# Patient Record
Sex: Male | Born: 1995 | Race: White | Hispanic: No | Marital: Single | Smoking: Never smoker
Health system: Southern US, Community
[De-identification: ages and names within clinical notes are randomized; demographics above are authoritative.]

---
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
## 2015-07-13 ENCOUNTER — Telehealth: Payer: Self-pay | Admitting: *Deleted

## 2015-07-13 ENCOUNTER — Encounter: Payer: Self-pay | Admitting: Family

## 2015-07-13 ENCOUNTER — Ambulatory Visit (INDEPENDENT_AMBULATORY_CARE_PROVIDER_SITE_OTHER): Payer: Managed Care, Other (non HMO) | Admitting: Family

## 2015-07-13 ENCOUNTER — Telehealth: Payer: Self-pay | Admitting: Family

## 2015-07-13 VITALS — BP 100/70 | HR 62 | Temp 97.7°F | Resp 16 | Ht 70.5 in | Wt 177.6 lb

## 2015-07-13 DIAGNOSIS — Z0000 Encounter for general adult medical examination without abnormal findings: Secondary | ICD-10-CM | POA: Diagnosis not present

## 2015-07-13 DIAGNOSIS — Z23 Encounter for immunization: Secondary | ICD-10-CM | POA: Diagnosis not present

## 2015-07-13 LAB — LIPID PANEL
Cholesterol: 190 mg/dL (ref 0–200)
HDL: 42.7 mg/dL (ref >39.00)
LDL CALC: 134 mg/dL — AB (ref 0–99)
NONHDL: 147.5
Total CHOL/HDL Ratio: 4
Triglycerides: 67 mg/dL (ref 0.0–149.0)
VLDL: 13.4 mg/dL (ref 0.0–40.0)

## 2015-07-13 LAB — URINALYSIS
Hgb urine dipstick: NEGATIVE
KETONES UR: NEGATIVE
Leukocytes, UA: NEGATIVE
Nitrite: NEGATIVE
PH: 5.5 (ref 5.0–8.0)
Specific Gravity, Urine: >=1.03 — AB (ref 1.000–1.030)
TOTAL PROTEIN, URINE-UPE24: NEGATIVE
Urine Glucose: NEGATIVE
Urobilinogen, UA: 0.2 (ref 0.0–1.0)

## 2015-07-13 NOTE — Patient Instructions (Signed)
Please complete lab work prior to leaving. Work on [employer]. Try preparing food to bring with you to school/work rather than fast food. Follow up in 2 months for Gardisil #2 Follow up in 6 months for Hepatitis A vaccine #2 and Gardisil #3. Welcome to [facility]!

## 2015-07-13 NOTE — Telephone Encounter (Signed)
Pt returned my call and was notified of below instructions and voices understanding.

## 2015-07-13 NOTE — Progress Notes (Signed)
Pre visit review using our clinic review tool, if applicable. No additional management support is needed unless otherwise documented below in the visit note. 

## 2015-07-13 NOTE — Telephone Encounter (Signed)
Due to patient school schedule requesting early appointment, nurse schedule conflicts. Approved by Nicki Guadalajara to schedule on provider schedule for Follow up in 2 months for Gardisil #2 Follow up in 6 months for Hepatitis A vaccine #2 and Gardisil #3.

## 2015-07-13 NOTE — Telephone Encounter (Signed)
Received call from pt's mother wanting verification of additional breathing testing needed. Advised her that I cannot discuss PHI with her until I receive authorization from pt. She voices understanding. Cell # listed [phone]) is pt's mom's. She gave me [phone] for pt. Will notify him that PFT has been scheduled for 07/16/15 at 11am at [facility]. He should arrive by 10:45am and register in admissions (main entrance). No caffeine for 4 hours prior to test, may eat prior. Call [phone] if any questions.  Left message for pt to return my call.

## 2015-07-13 NOTE — Progress Notes (Signed)
Subjective:    Patient ID: Bobby Rogers, male    DOB: May 01, 1996, 19 y.o.   MRN: [id number]  HPI  Bobby Rogers is a 19 yr old male who presents today to establish care.  Patient presents today for complete physical.  Immunizations: Diet:could be better, eats out too much. Exercise:  Regular exercise Dental:  Due   Review of Systems  Constitutional: Negative for unexpected weight change.  HENT: Negative for hearing loss and rhinorrhea.   Eyes: Negative for visual disturbance.  Respiratory: Negative for cough and shortness of breath.   Cardiovascular: Negative for chest pain.  Gastrointestinal: Negative for diarrhea and constipation.  Genitourinary: Negative for dysuria and frequency.  Musculoskeletal:       L knee pain x 2 days.    Skin: Negative for rash.  Neurological: Negative for headaches.  Hematological: Negative for adenopathy.  Psychiatric/Behavioral:       Denies depression/anxiety   History reviewed. No pertinent past medical history.  History   Social History  . Marital Status: Single    Spouse Name: N/A  . Number of Children: N/A  . Years of Education: N/A   Occupational History  . Not on file.   Social History Main Topics  . Smoking status: Never Smoker   . Smokeless tobacco: Not on file  . Alcohol Use: No     Comment: social  . Drug Use: No  . Sexual Activity: Not on file   Other Topics Concern  . Not on file   Social History Narrative    History reviewed. No pertinent past surgical history.  Family History  Problem Relation Age of Onset  . Sudden death Neg Hx   . Hypertension Neg Hx   . Hyperlipidemia Neg Hx   . Heart attack Neg Hx   . Diabetes Neg Hx     Not on File  No current outpatient prescriptions on file prior to visit.   No current facility-administered medications on file prior to visit.    BP 100/70 mmHg  Pulse 62  Temp(Src) 97.7 F (36.5 C) (Oral)  Resp 16  Ht 5' 10.5" (1.791 m)  Wt 177 lb 9.6 oz (80.559 kg)   BMI 25.11 kg/m2  SpO2 99%       Objective:   Physical Exam  Physical Exam  Constitutional: He is oriented to person, place, and time. He appears well-developed and well-nourished. No distress.  HENT:  Head: Normocephalic and atraumatic.  Right Ear: Tympanic membrane and ear canal normal.  Left Ear: Tympanic membrane and ear canal normal.  Mouth/Throat: Oropharynx is clear and moist.  Eyes: Pupils are equal, round, and reactive to light. No scleral icterus.  Neck: Normal range of motion. No thyromegaly present.  Cardiovascular: Normal rate and regular rhythm.   No murmur heard. Pulmonary/Chest: Effort normal and breath sounds normal. No respiratory distress. He has no wheezes. He has no rales. He exhibits no tenderness.  Abdominal: Soft. Bowel sounds are normal. He exhibits no distension and no mass. There is no tenderness. There is no rebound and no guarding.  Musculoskeletal: He exhibits no edema. No knee swelling is noted Lymphadenopathy:    He has no cervical adenopathy.  Neurological: He is alert and oriented to person, place, and time. He has normal patellar reflexes. He exhibits normal muscle tone. Coordination normal.  Skin: Skin is warm and dry.  Psychiatric: He has a normal mood and affect. His behavior is normal. Judgment and thought content normal.  Assessment & Plan:         Assessment & Plan:  Addendum: pt was unable to complete PFT's successfully in our office. Will refer for formal PFT's.   Advised short course of aleve for knee pain and let me know if symptoms worsen or do not improve.

## 2015-07-13 NOTE — Assessment & Plan Note (Signed)
PFT's today (needed for form for [employer]).  Obtain UA and Lipids. Discussed healthy diet. Continue regular exercise. Due for HPV, Tdap, varicella #2, Hep A #1, Meningococcal

## 2015-07-14 ENCOUNTER — Encounter: Payer: Self-pay | Admitting: Family

## 2015-07-15 ENCOUNTER — Ambulatory Visit (HOSPITAL_COMMUNITY)
Admission: RE | Admit: 2015-07-15 | Discharge: 2015-07-15 | Disposition: A | Discharge status: Home / Self Care | Payer: 59 | Source: Ambulatory Visit | Attending: Family | Admitting: Family

## 2015-07-15 DIAGNOSIS — Z0000 Encounter for general adult medical examination without abnormal findings: Secondary | ICD-10-CM | POA: Diagnosis not present

## 2015-07-15 LAB — SPIROMETRY WITH GRAPH
DL/VA % pred: 96 %
DL/VA: 4.54 ml/min/mmHg/L
DLCO UNC % PRED: 100 %
DLCO UNC: 32.37 ml/min/mmHg
FEF 25-75 PRE: 2.83 L/s
FEF2575-%Pred-Pre: 57 %
FEV1-%Pred-Pre: 79 %
FEV1-Pre: 3.67 L
FEV1FVC-%Pred-Pre: 82 %
FEV6-%PRED-PRE: 93 %
FEV6-Pre: 5.15 L
FEV6FVC-%PRED-PRE: 100 %
FVC-%PRED-PRE: 96 %
FVC-Pre: 5.31 L
Pre FEV1/FVC ratio: 69 %
Pre FEV6/FVC Ratio: 100 %
RV % pred: 113 %
RV: 1.6 L
TLC % pred: 99 %
TLC: 6.84 L

## 2015-07-16 ENCOUNTER — Telehealth: Payer: Self-pay | Admitting: *Deleted

## 2015-07-16 DIAGNOSIS — R942 Abnormal results of pulmonary function studies: Secondary | ICD-10-CM

## 2015-07-16 NOTE — Telephone Encounter (Signed)
Pt needs form for college as soon as possible. Please see PFT report from 07/15/15.

## 2015-07-19 NOTE — Telephone Encounter (Signed)
wAS ABLE TO GET APPOINTMENT ON tHURSDAY THIS WEEK WITH dR wERT

## 2015-07-19 NOTE — Telephone Encounter (Signed)
Left message for pt on home phone.    PFT's are abnormal.  I would like to get him in with pulmonary for clearance.  Marj/Jen- is it possible to get him in with pulmonary today, tomorrow or Wednesday?  We can set him  Up with Rubye Oaks NP if she is able to see pt.  He has time restraint for his clearance for school.

## 2015-07-19 NOTE — Telephone Encounter (Signed)
Patient is scheduled for 07/22/15

## 2015-07-22 ENCOUNTER — Ambulatory Visit (INDEPENDENT_AMBULATORY_CARE_PROVIDER_SITE_OTHER): Payer: Managed Care, Other (non HMO) | Admitting: Internal Medicine

## 2015-07-22 ENCOUNTER — Encounter: Payer: Self-pay | Admitting: *Deleted

## 2015-07-22 ENCOUNTER — Encounter: Payer: Self-pay | Admitting: Internal Medicine

## 2015-07-22 VITALS — BP 118/78 | HR 62 | Ht 70.5 in | Wt 182.0 lb

## 2015-07-22 DIAGNOSIS — R942 Abnormal results of pulmonary function studies: Secondary | ICD-10-CM

## 2015-07-22 NOTE — Progress Notes (Signed)
   Subjective:    Patient ID: Bobby Rogers, male    DOB: [birth year],    MRN: [id number]  HPI  28 yowm never smoker nl term deliver/ no illnesses as infant per phone call to mom, no memory of any problem as child/ adolescent with sob/cough or freq resp infections or ever need for inhalers  but referred by Dr Peggyann Juba 07/22/2015 to pulmonary clinic after he failed a screening spirometry.   07/22/2015 1st [facility] office visit/ Bobby Rogers   Chief Complaint  Patient presents with  . Pulmonary Consult    Referred by Sandford Craze for abnormal PFT   able to ex in all condtions all year round including intense exercise s resp limitations   No obvious  day to day or daytime variabilty or assoc chronic cough or cp or chest tightness, subjective wheeze overt sinus or hb symptoms. No unusual exp hx or h/o childhood pna/ asthma or knowledge of premature birth.  Sleeping ok without nocturnal  or early am exacerbation  of respiratory  c/o's or need for noct saba. Also denies any obvious fluctuation of symptoms with weather or environmental changes or other aggravating or alleviating factors except as outlined above   Current Medications, Allergies, Complete Past Medical History, Past Surgical History, Family History, and Social History were reviewed in [employer] record.           Review of Systems  Constitutional: Negative for fever, chills, activity change, appetite change and unexpected weight change.  HENT: Negative for congestion, dental problem, postnasal drip, rhinorrhea, sneezing, sore throat, trouble swallowing and voice change.   Eyes: Negative for visual disturbance.  Respiratory: Negative for cough, choking and shortness of breath.   Cardiovascular: Negative for chest pain and leg swelling.  Gastrointestinal: Negative for nausea, vomiting and abdominal pain.  Genitourinary: Negative for difficulty urinating.  Musculoskeletal: Negative for arthralgias.    Skin: Negative for rash.  Psychiatric/Behavioral: Negative for behavioral problems and confusion.       Objective:   Physical Exam  amb wm nad  Wt Readings from Last 3 Encounters:  07/22/15 182 lb (82.555 kg) (83 %*, Z = 0.95)  07/13/15 177 lb 9.6 oz (80.559 kg) (79 %*, Z = 0.82)  05/28/14 164 lb (74.39 kg) (70 %*, Z = 0.52)   * Growth percentiles are based on CDC 2-20 Years data.    Vital signs reviewed   HEENT: nl dentition, turbinates, and orophanx. Nl external ear canals without cough reflex   NECK :  without JVD/Nodes/TM/ nl carotid upstrokes bilaterally   LUNGS: no acc muscle use, clear to A and P bilaterally without cough on insp or exp maneuvers   CV:  RRR  no s3 or murmur or increase in P2, no edema   ABD:  soft and nontender with nl excursion in the supine position. No bruits or organomegaly, bowel sounds nl  MS:  warm without deformities, calf tenderness, cyanosis or clubbing  SKIN: warm and dry without lesions    NEURO:  alert, approp, no deficits           Assessment & Plan:

## 2015-07-22 NOTE — Patient Instructions (Signed)
pfts are normal - you do not have any clinical evidence of asthma at all.

## 2015-07-22 NOTE — Assessment & Plan Note (Signed)
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

## 2015-07-23 ENCOUNTER — Telehealth: Payer: Self-pay | Admitting: Family

## 2015-07-23 NOTE — Telephone Encounter (Signed)
Caller name: [place], [place] Relation to pt: [email or web address]  Call back [phone] fax# [phone]    Reason for call:  As per mother patient is attending Psychologist, clinical, mother checking on the status. Form was dropped off 07/13/2015 mother states form needs to be signed and stating patient had physical and he is in good standing. Fax # [phone].

## 2015-09-20 ENCOUNTER — Ambulatory Visit: Payer: Managed Care, Other (non HMO) | Admitting: Family

## 2015-09-20 DIAGNOSIS — Z0289 Encounter for other administrative examinations: Secondary | ICD-10-CM

## 2016-01-07 ENCOUNTER — Telehealth: Payer: Self-pay | Admitting: Family

## 2016-01-07 NOTE — Telephone Encounter (Signed)
Health Maintenance  updated

## 2016-01-07 NOTE — Telephone Encounter (Signed)
Patient states he has had his Flu Shot for this season but does not remember when or where he had it

## 2016-01-14 ENCOUNTER — Ambulatory Visit: Payer: Self-pay | Admitting: Family

## 2016-01-17 ENCOUNTER — Telehealth: Payer: Self-pay | Admitting: Family

## 2016-01-17 ENCOUNTER — Encounter: Payer: Self-pay | Admitting: Family

## 2016-01-17 NOTE — Telephone Encounter (Signed)
Yes please

## 2016-01-17 NOTE — Telephone Encounter (Signed)
Marked to charge and mailing no show letter °

## 2016-01-17 NOTE — Telephone Encounter (Signed)
Pt was no show 01/14/16 7:00am for follow up appt, pt has not rescheduled, 2nd no show, charge or no charge?

## 2017-09-10 ENCOUNTER — Ambulatory Visit (INDEPENDENT_AMBULATORY_CARE_PROVIDER_SITE_OTHER): Payer: BLUE CROSS/BLUE SHIELD | Admitting: Family

## 2017-09-10 ENCOUNTER — Encounter: Payer: Self-pay | Admitting: Family

## 2017-09-10 VITALS — BP 137/87 | HR 93 | Temp 98.5°F | Resp 16 | Ht 70.5 in | Wt 187.6 lb

## 2017-09-10 DIAGNOSIS — J020 Streptococcal pharyngitis: Secondary | ICD-10-CM

## 2017-09-10 DIAGNOSIS — R030 Elevated blood-pressure reading, without diagnosis of hypertension: Secondary | ICD-10-CM | POA: Diagnosis not present

## 2017-09-10 DIAGNOSIS — J029 Acute pharyngitis, unspecified: Secondary | ICD-10-CM

## 2017-09-10 LAB — POCT RAPID STREP A (OFFICE): Rapid Strep A Screen: POSITIVE — AB

## 2017-09-10 MED ORDER — AMOXICILLIN 500 MG PO CAPS: 500.0000 mg | ORAL_CAPSULE | Freq: Three times a day (TID) | ORAL | 0 refills | Status: AC

## 2017-09-10 MED ORDER — LIDOCAINE VISCOUS 2 % MT SOLN: 15.0000 mL | Freq: Four times a day (QID) | OROMUCOSAL | 0 refills | Status: AC | PRN

## 2017-09-10 MED ORDER — PREDNISONE 10 MG PO TABS: ORAL_TABLET | ORAL | 0 refills | Status: AC

## 2017-09-10 NOTE — Progress Notes (Signed)
Subjective:    Patient ID: Bobby Rogers, male    DOB: 24-Mar-1996, 21 y.o.   MRN: [id number]  HPI  Patient is a 21 year old male who presents today with chief complaint of sore throat. He reports that he has had a sore throat for 5 days. He is also had intermittent fevers. Has associated headache for 1 week. Notes some mild shortness of breath and fatigue. Denies cough.  Denies ear pain.     Review of Systems See HPI  No past medical history on file.   Social History   Social History  . Marital status: Single    Spouse name: N/A  . Number of children: N/A  . Years of education: N/A   Occupational History  . Not on file.   Social History Main Topics  . Smoking status: Never Smoker  . Smokeless tobacco: Never Used  . Alcohol use Yes     Comment: social  . Drug use: No  . Sexual activity: Not on file   Other Topics Concern  . Not on file   Social History Narrative   Lives with mom- has older sister   In [employer]   Plays baseball/football/wrestling   [employer]    No past surgical history on file.  Family History  Problem Relation Age of Onset  . Sudden death Neg Hx   . Hypertension Neg Hx   . Hyperlipidemia Neg Hx   . Heart attack Neg Hx   . Diabetes Neg Hx     No Known Allergies  No current outpatient prescriptions on file prior to visit.   No current facility-administered medications on file prior to visit.     BP 137/87 (BP Location: Right Arm, Cuff Size: Normal)   Pulse 93   Temp 98.5 F (36.9 C) (Oral)   Resp 16   Ht 5' 10.5" (1.791 m)   Wt 187 lb 9.6 oz (85.1 kg)   SpO2 99%   BMI 26.54 kg/m       Objective:   Physical Exam  Constitutional: He is oriented to person, place, and time. He appears well-developed and well-nourished. No distress.  HENT:  Head: Normocephalic and atraumatic.  Tonsils are 3/4+ in size. Some exudate noted on right tonsil. Erythema is also noted.   Eyes: No scleral icterus.  Right sclera is mildly  injected. No discharge is noted.  Cardiovascular: Normal rate and regular rhythm.   No murmur heard. Pulmonary/Chest: Effort normal and breath sounds normal. No respiratory distress. He has no wheezes. He has no rales.  Musculoskeletal: He exhibits no edema.  Lymphadenopathy:    He has no cervical adenopathy.  Neurological: He is alert and oriented to person, place, and time.  Skin: Skin is warm and dry.  Psychiatric: He has a normal mood and affect. His behavior is normal. Thought content normal.          Assessment & Plan:  Strep throat-rapid strep is positive today. Patient will be treated with amoxicillin, as well as viscous lidocaine. Due to his severe discomfort and tonsillar swelling I will also treat him with a short burst of prednisone. He is advised to call if symptoms are not improved in 2-3 days. He is advised to go to the ER if severe worsening symptoms. He does have some gum pain. I'm hopeful that the viscous lidocaine will help this however he is advised to follow-up with his dentist if this does not improve.  Blood pressure is mildly elevated today.  Advised patient to return in about 2 months for blood pressure recheck.  Right eye looks mildly irritated today-she reports this is his baseline from his allergies. Advised patient to let me know if he develops any discharge or increased redness.

## 2017-09-10 NOTE — Patient Instructions (Signed)
Your strep test is positive. Please start amoxicillin (antibiotic), prednisone for tonsil swelling, and viscous lidocaine for throat pain. Call if new or worsening symptoms. If severe symptoms please go to the emergency room. Please let us know if symptoms are not improved in 3 days.

## 2017-09-11 ENCOUNTER — Telehealth: Payer: Self-pay | Admitting: *Deleted

## 2017-09-11 NOTE — Telephone Encounter (Signed)
Notified pt's mom and she will call us back to schedule appointment if pt not improving per below recommendation.  Sandford Craze, NP  to Me      4:10 PM  He should use viscous lidocaine and ibuprofen. If not improved in 2 days with antibiotics need follow up in office please.   Lerry Liner, CMA  to Sandford Craze, NP     2:15 PM  MO-Plz see message/thx dmf   Bobby Rogers  to Sandford Craze, NP     1:30 PM  This is in regards to my son Bobby Rogers that saw Melissa yesterday. His tongue, teeth and gums are still hurting him and he is in alot of pain. Does he need to come back in or can you send him something to [facility] for the pain?  Or, you are welcome to call me on my cell [phone]. Thank you, Mercer Pod

## 2017-09-12 ENCOUNTER — Ambulatory Visit (INDEPENDENT_AMBULATORY_CARE_PROVIDER_SITE_OTHER): Payer: BLUE CROSS/BLUE SHIELD | Admitting: Family

## 2017-09-12 VITALS — BP 127/75 | HR 81 | Temp 98.2°F | Ht 70.5 in | Wt 185.0 lb

## 2017-09-12 DIAGNOSIS — K068 Other specified disorders of gingiva and edentulous alveolar ridge: Secondary | ICD-10-CM

## 2017-09-12 DIAGNOSIS — J020 Streptococcal pharyngitis: Secondary | ICD-10-CM | POA: Diagnosis not present

## 2017-09-12 DIAGNOSIS — B009 Herpesviral infection, unspecified: Secondary | ICD-10-CM | POA: Diagnosis not present

## 2017-09-12 MED ORDER — VALACYCLOVIR HCL 1 G PO TABS: ORAL_TABLET | ORAL | 0 refills | Status: AC

## 2017-09-12 MED ORDER — ACETAMINOPHEN-CODEINE #3 300-30 MG PO TABS: 1.0000 | ORAL_TABLET | Freq: Four times a day (QID) | ORAL | 0 refills | Status: AC | PRN

## 2017-09-12 NOTE — Patient Instructions (Addendum)
Please begin orabase (over the counter) topical anesthetic to affected areas on your tongue/gums. Continue ibuprofen as needed. Complete prednisone and amoxicillin. Begin valtrex for cold sore.  Schedule follow up with your dentist to discuss gum pain. You may use tylenol #3 every 6 hours as needed short term for pain.  Do not drive or work after taking this medication.  Let us know if symptoms do not improve with above recommendations.

## 2017-09-12 NOTE — Progress Notes (Signed)
Subjective:    Patient ID: Bobby Rogers, male    DOB: 05-22-1996, 21 y.o.   MRN: [id number]  HPI  Patient is a 21 yr old male who was seen initially for sore throat on 09/10/17.  Was started on amoxicillin and prednisone.  He reports that his throat is no longer as sore but he has sore areas on his tongue and a cold sore on his lip.  Still having gum pain.  Viscous lidocaine only helps "for 2 minutes."  Continues amoxicillin and prednisone.     Review of Systems See HPI  No past medical history on file.   Social History   Social History  . Marital status: Single    Spouse name: N/A  . Number of children: N/A  . Years of education: N/A   Occupational History  . Not on file.   Social History Main Topics  . Smoking status: Never Smoker  . Smokeless tobacco: Never Used  . Alcohol use Yes     Comment: social  . Drug use: No  . Sexual activity: Not on file   Other Topics Concern  . Not on file   Social History Narrative   Lives with mom- has older sister   In [employer]   Plays baseball/football/wrestling   [employer]    No past surgical history on file.  Family History  Problem Relation Age of Onset  . Sudden death Neg Hx   . Hypertension Neg Hx   . Hyperlipidemia Neg Hx   . Heart attack Neg Hx   . Diabetes Neg Hx     No Known Allergies  Current Outpatient Prescriptions on File Prior to Visit  Medication Sig Dispense Refill  . amoxicillin (AMOXIL) 500 MG capsule Take 1 capsule (500 mg total) by mouth 3 (three) times daily. 30 capsule 0  . lidocaine (XYLOCAINE) 2 % solution Use as directed 15 mLs in the mouth or throat every 6 (six) hours as needed for mouth pain. 300 mL 0  . predniSONE (DELTASONE) 10 MG tablet 4 tabs by mouth once daily for 4 days 16 tablet 0   No current facility-administered medications on file prior to visit.     BP 127/75 (BP Location: Left Arm, Cuff Size: Normal)   Pulse 81   Temp 98.2 F (36.8 C) (Oral)   Ht 5' 10.5"  (1.791 m)   Wt 185 lb (83.9 kg)   BMI 26.17 kg/m       Objective:   Physical Exam  Constitutional: He is oriented to person, place, and time. He appears well-developed and well-nourished. No distress.  HENT:  Head: Normocephalic and atraumatic.  Right Ear: Tympanic membrane and ear canal normal.  Left Ear: Tympanic membrane and ear canal normal.  Mouth/Throat: No oropharyngeal exudate or posterior oropharyngeal edema.  2+ tonsils  Cold sore lesion noted right lower lip  Hypertrophic gums  Several areas of inflammed taste buds noted on tongue  Cardiovascular: Normal rate and regular rhythm.   No murmur heard. Pulmonary/Chest: Effort normal and breath sounds normal. No respiratory distress. He has no wheezes. He has no rales.  Musculoskeletal: He exhibits no edema.  Neurological: He is alert and oriented to person, place, and time.  Skin: Skin is warm and dry.  Psychiatric: He has a normal mood and affect. His behavior is normal. Thought content normal.          Assessment & Plan:  Strep throat- pain and exam is improved. Advised pt  to continue amoxicillin and prednisone.  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   Trial of topical orabase prn. Rx provided for short course of PRN tylenol #3- pt is advised as follows:  Do not drive or work after taking this medication.  Let us know if symptoms do not improve with above recommendations.

## 2017-09-14 ENCOUNTER — Telehealth: Payer: Self-pay | Admitting: *Deleted

## 2017-09-14 NOTE — Telephone Encounter (Signed)
Pt's mom sent below mychart message in her record on pt's behalf yesterday. I attempted to reach pt and spoke with his mom. She states that pt was able to make it in to work today and note is no longer needed. Advised her to have pt call us if anything changes.  Hi Dr. Efraim Kaufmann,    My son Bobby Rogers has been in to see you two times this week. He is feeling some better but wanted to know if you would write him a note to return to work on Monday, 10/8. His original note was through today, 10/4. He is scheduled to work on Friday, 10/5 and Saturday, 10/6. Can you write him out for those additional two days. 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 If you can, Please fax it to me at [phone]. I appreciate you. If you cannot, I understand. Thank you Mercer Pod
